# Patient Record
Sex: Male | Born: 1990 | Race: White | Hispanic: No | Smoking: Current every day smoker
Health system: Southern US, Community
[De-identification: ages and names within clinical notes are randomized; demographics above are authoritative.]

## PROBLEM LIST (undated history)

## (undated) DIAGNOSIS — Z87898 Personal history of other specified conditions: Secondary | ICD-10-CM

## (undated) HISTORY — PX: RHINOPLASTY: SHX2354

## (undated) HISTORY — PX: APPENDECTOMY: SHX54

## (undated) HISTORY — DX: Personal history of other specified conditions: Z87.898

---

## 2019-11-03 ENCOUNTER — Other Ambulatory Visit: Payer: Self-pay

## 2019-11-03 ENCOUNTER — Emergency Department (INDEPENDENT_AMBULATORY_CARE_PROVIDER_SITE_OTHER)
Admission: EM | Admit: 2019-11-03 | Discharge: 2019-11-03 | Disposition: A | Discharge status: Home / Self Care | Payer: Managed Care, Other (non HMO) | Source: Home / Self Care | Attending: Family Medicine | Admitting: Family Medicine

## 2019-11-03 ENCOUNTER — Encounter: Payer: Self-pay | Admitting: Emergency Medicine

## 2019-11-03 DIAGNOSIS — R000 Tachycardia, unspecified: Secondary | ICD-10-CM | POA: Diagnosis not present

## 2019-11-03 DIAGNOSIS — Z711 Person with feared health complaint in whom no diagnosis is made: Secondary | ICD-10-CM

## 2019-11-03 NOTE — ED Provider Notes (Signed)
[facility]    CSN: [id number] Arrival date & time: 11/03/19  1415      History   Chief Complaint Chief Complaint  Patient presents with  . Tachycardia    HPI Alejandro Jenkins is a 29 y.o. male.   Patient reports that he was mowing grass this morning and began to feel weak at about noon.  He admits that he had not been drinking enough fluids.  He checked his pulse at 208.  He denies chest pain, shortness of breath, light-headedness, and dizziness.  He feels better after resting and drinking increased fluids.  He states that he has had two previous episodes of shortness of breath upon awakening, without rapid heart rate.  The history is provided by the patient.  Palpitations Palpitations quality:  Regular Onset quality:  Sudden Timing:  Constant Progression:  Resolved Chronicity:  New Context: dehydration and exercise   Relieved by: rest. Worsened by:  Nothing Ineffective treatments:  None tried Associated symptoms: malaise/fatigue and weakness   Associated symptoms: no back pain, no chest pain, no chest pressure, no cough, no diaphoresis, no dizziness, no leg pain, no lower extremity edema, no nausea, no near-syncope, no numbness, no orthopnea, no shortness of breath, no syncope and no vomiting     Past Medical History:  Diagnosis Date  . History of snoring     There are no problems to display for this patient.   Past Surgical History:  Procedure Laterality Date  . APPENDECTOMY    . RHINOPLASTY         Home Medications    Prior to Admission medications   Medication Sig Start Date End Date Taking? Authorizing Provider  fluticasone (FLONASE) 50 MCG/ACT nasal spray Place into both nostrils daily.   Yes [provider]  loratadine (CLARITIN) 10 MG tablet Take 10 mg by mouth daily.   Yes [provider]    Family History Family History  Problem Relation Age of Onset  . Diabetes Mother   . Healthy Father     Social  History Social History   Tobacco Use  . Smoking status: Current Every Day Smoker    Types: E-cigarettes  . Smokeless tobacco: Current User  Vaping Use  . Vaping Use: Every day  . Substances: Nicotine, Flavoring  Substance Use Topics  . Alcohol use: Not Currently  . Drug use: Never     Allergies   Patient has no allergy information on record.   Review of Systems Review of Systems  Constitutional: Positive for activity change and malaise/fatigue. Negative for appetite change, chills, diaphoresis, fatigue and fever.  Respiratory: Negative for cough and shortness of breath.   Cardiovascular: Positive for palpitations. Negative for chest pain, orthopnea, syncope and near-syncope.  Gastrointestinal: Negative for nausea and vomiting.  Musculoskeletal: Negative for back pain.  Neurological: Positive for weakness. Negative for dizziness and numbness.  All other systems reviewed and are negative.    Physical Exam Triage Vital Signs ED Triage Vitals  Enc Vitals Group     BP 11/03/19 1433 117/81     Pulse Rate 11/03/19 1433 (!) 108     Resp 11/03/19 1433 15     Temp 11/03/19 1433 99 F (37.2 C)     Temp Source 11/03/19 1433 Oral     SpO2 11/03/19 1433 99 %     Weight 11/03/19 1436 234 lb (106.1 kg)     Height 11/03/19 1436 6\' 3"  (1.905 m)     Head Circumference --  Peak Flow --      Pain Score 11/03/19 1436 0     Pain Loc --      Pain Edu? --      Excl. in GC? --    No data found.  Updated Vital Signs BP 117/81 (BP Location: Right Arm)   Pulse (!) 108   Temp 99 F (37.2 C) (Oral)   Resp 15   Ht 6\' 3"  (1.905 m)   Wt 106.1 kg   SpO2 99%   BMI 29.25 kg/m   Visual Acuity Right Eye Distance:   Left Eye Distance:   Bilateral Distance:    Right Eye Near:   Left Eye Near:    Bilateral Near:     Physical Exam Nursing notes and Vital Signs reviewed. Appearance:  Patient appears stated age, and in no acute distress Eyes:  Pupils are equal, round, and  reactive to light and accomodation.  Extraocular movement is intact.  Conjunctivae are not inflamed  Ears:  Canals normal.  Tympanic membranes normal.  Nose:  Normal turbinates.  No sinus tenderness.  Pharynx:  Normal Neck:  Supple. No adenopathy Lungs:  Clear to auscultation.  Breath sounds are equal.  Moving air well. Heart:  Regular rate and rhythm without murmurs, rubs, or gallops. Rate 99 Abdomen:  Nontender without masses or hepatosplenomegaly.  Bowel sounds are present.  No CVA or flank tenderness.  Extremities:  No edema.  Skin:  No rash present.   UC Treatments / Results  Labs (all labs ordered are listed, but only abnormal results are displayed) Labs Reviewed - No data to display  EKG  Rate:  99 BPM PR:  168 msec QT:  330 msec QTcH:  423 msec QRSD:  76 msec QRS axis:  61 degrees Interpretation:  within normal limits; normal sinus rhythm    Radiology No results found.  Procedures Procedures (including critical care time)  Medications Ordered in UC Medications - No data to display  Initial Impression / Assessment and Plan / UC Course  I have reviewed the triage vital signs and the nursing notes.  Pertinent labs & imaging results that were available during my care of the patient were reviewed by me and considered in my medical decision making (see chart for details).    Normal exam and EKG reassuring. Recommend follow-up with PCP if recurring episodes of shortness of breath recur at night.   Final Clinical Impressions(s) / UC Diagnoses   Final diagnoses:  Tachycardia  Feared condition not demonstrated     Discharge Instructions     If symptoms become significantly worse during the night or over the weekend, proceed to the local emergency room.     ED Prescriptions    None        , MD 11/05/19 01/06/20

## 2019-11-03 NOTE — ED Triage Notes (Signed)
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

## 2019-11-03 NOTE — Discharge Instructions (Addendum)
If symptoms become significantly worse during the night or over the weekend, proceed to the local emergency room.  

## 2020-09-01 ENCOUNTER — Emergency Department (HOSPITAL_COMMUNITY): Payer: Managed Care, Other (non HMO)

## 2020-09-01 ENCOUNTER — Other Ambulatory Visit: Payer: Self-pay

## 2020-09-01 ENCOUNTER — Emergency Department (HOSPITAL_COMMUNITY)
Admission: EM | Admit: 2020-09-01 | Discharge: 2020-09-01 | Disposition: A | Discharge status: Home / Self Care | Payer: Managed Care, Other (non HMO) | Attending: Emergency Medicine | Admitting: Emergency Medicine

## 2020-09-01 DIAGNOSIS — R1032 Left lower quadrant pain: Secondary | ICD-10-CM | POA: Diagnosis not present

## 2020-09-01 DIAGNOSIS — I861 Scrotal varices: Secondary | ICD-10-CM | POA: Insufficient documentation

## 2020-09-01 DIAGNOSIS — F17290 Nicotine dependence, other tobacco product, uncomplicated: Secondary | ICD-10-CM | POA: Diagnosis not present

## 2020-09-01 DIAGNOSIS — N433 Hydrocele, unspecified: Secondary | ICD-10-CM | POA: Insufficient documentation

## 2020-09-01 DIAGNOSIS — N50812 Left testicular pain: Secondary | ICD-10-CM | POA: Diagnosis present

## 2020-09-01 LAB — URINALYSIS, ROUTINE W REFLEX MICROSCOPIC
Bilirubin Urine: NEGATIVE
Glucose, UA: NEGATIVE mg/dL
Hgb urine dipstick: NEGATIVE
Ketones, ur: NEGATIVE mg/dL
Leukocytes,Ua: NEGATIVE
Nitrite: NEGATIVE
Protein, ur: NEGATIVE mg/dL
Specific Gravity, Urine: 1.03 (ref 1.005–1.030)
pH: 5 (ref 5.0–8.0)

## 2020-09-01 LAB — CBC WITH DIFFERENTIAL/PLATELET
Abs Immature Granulocytes: 0.02 10*3/uL (ref 0.00–0.07)
Basophils Absolute: 0 10*3/uL (ref 0.0–0.1)
Basophils Relative: 1 %
Eosinophils Absolute: 0.4 10*3/uL (ref 0.0–0.5)
Eosinophils Relative: 6 %
HCT: 47.7 % (ref 39.0–52.0)
Hemoglobin: 16 g/dL (ref 13.0–17.0)
Immature Granulocytes: 0 %
Lymphocytes Relative: 36 %
Lymphs Abs: 2.2 10*3/uL (ref 0.7–4.0)
MCH: 30.4 pg (ref 26.0–34.0)
MCHC: 33.5 g/dL (ref 30.0–36.0)
MCV: 90.5 fL (ref 80.0–100.0)
Monocytes Absolute: 0.6 10*3/uL (ref 0.1–1.0)
Monocytes Relative: 10 %
Neutro Abs: 2.9 10*3/uL (ref 1.7–7.7)
Neutrophils Relative %: 47 %
Platelets: 268 10*3/uL (ref 150–400)
RBC: 5.27 MIL/uL (ref 4.22–5.81)
RDW: 12.7 % (ref 11.5–15.5)
WBC: 6.2 10*3/uL (ref 4.0–10.5)
nRBC: 0 % (ref 0.0–0.2)

## 2020-09-01 LAB — BASIC METABOLIC PANEL
Anion gap: 9 (ref 5–15)
BUN: 15 mg/dL (ref 6–20)
CO2: 25 mmol/L (ref 22–32)
Calcium: 9 mg/dL (ref 8.9–10.3)
Chloride: 106 mmol/L (ref 98–111)
Creatinine, Ser: 0.9 mg/dL (ref 0.61–1.24)
GFR, Estimated: >60 mL/min (ref >60)
Glucose, Bld: 78 mg/dL (ref 70–99)
Potassium: 4.4 mmol/L (ref 3.5–5.1)
Sodium: 140 mmol/L (ref 135–145)

## 2020-09-01 MED ORDER — IOHEXOL 300 MG/ML  SOLN
100.0000 mL | Freq: Once | INTRAMUSCULAR | Status: AC | PRN
  Administered 2020-09-01: 100 mL via INTRAVENOUS

## 2020-09-01 MED ORDER — OXYCODONE HCL 5 MG PO TABS: 5.0000 mg | ORAL_TABLET | Freq: Four times a day (QID) | ORAL | 0 refills | Status: AC | PRN

## 2020-09-01 MED ORDER — SODIUM CHLORIDE 0.9 % IV BOLUS
1000.0000 mL | Freq: Once | INTRAVENOUS | Status: AC
  Administered 2020-09-01: 1000 mL via INTRAVENOUS

## 2020-09-01 MED ORDER — NAPROXEN 500 MG PO TABS: 500.0000 mg | ORAL_TABLET | Freq: Two times a day (BID) | ORAL | 0 refills | Status: AC

## 2020-09-01 MED ORDER — ONDANSETRON HCL 4 MG/2ML IJ SOLN
4.0000 mg | Freq: Once | INTRAMUSCULAR | Status: AC
  Administered 2020-09-01: 4 mg via INTRAVENOUS
  Filled 2020-09-01: qty 2

## 2020-09-01 MED ORDER — MORPHINE SULFATE (PF) 4 MG/ML IV SOLN
4.0000 mg | Freq: Once | INTRAVENOUS | Status: AC
  Administered 2020-09-01: 4 mg via INTRAVENOUS
  Filled 2020-09-01: qty 1

## 2020-09-01 NOTE — Discharge Instructions (Signed)
You ice and heat to the area.  Take the naproxen scheduled over the next 3 days. DO not take additional ibuprofen containing products  Take the pain medication as prescribed. This medication may become addictive. Do not drive or operate heavy machinery while thaking this medication.  Follow up with Urology if symptoms have not improved.  If you have worsening pain seek reevaluation here in the ED

## 2020-09-01 NOTE — ED Provider Notes (Signed)
[facility] EMERGENCY DEPARTMENT Provider Note   CSN: [id number] Arrival date & time: 09/01/20  7824     History Chief Complaint  Patient presents with  . Groin Pain    Alejandro Jenkins is a 30 y.o. male with no significant past medical history who presents for evaluation of left-sided testicular pain.  Began suddenly this morning.  Pain constant however worse with movement.  Feels better if he lays flat.  Did state he recently moved yesterday.  He has not noted any redness, swelling, warmth to his testicles.  He denies any penile discharge or any concerns for any STDs.  No rashes.  He denies any prior history of inguinal hernias.  Not taking anything for his pain.  Rates his pain a 10/10.  No dysuria, hematuria, flank pain.  No prior history of kidney stones.  Denies fever, chills, nausea, vomiting, chest pain, shortness of breath, back pain, urinary complaints, paresthesias or weakness.  Denies additional aggravating or alleviating factors.  History obtained from patient and past medical records.  No interpreter used  HPI     Past Medical History:  Diagnosis Date  . History of snoring     There are no problems to display for this patient.   Past Surgical History:  Procedure Laterality Date  . APPENDECTOMY    . RHINOPLASTY         Family History  Problem Relation Age of Onset  . Diabetes Mother   . Healthy Father     Social History   Tobacco Use  . Smoking status: Current Every Day Smoker    Types: E-cigarettes  . Smokeless tobacco: Current User  Vaping Use  . Vaping Use: Every day  . Substances: Nicotine, Flavoring  Substance Use Topics  . Alcohol use: Not Currently  . Drug use: Never    Home Medications Prior to Admission medications   Medication Sig Start Date End Date Taking? Authorizing Provider  naproxen (NAPROSYN) 500 MG tablet Take 1 tablet (500 mg total) by mouth 2 (two) times daily. 09/01/20  Yes Sanaa Zilberman A, PA-C   oxyCODONE (ROXICODONE) 5 MG immediate release tablet Take 1 tablet (5 mg total) by mouth every 6 (six) hours as needed for up to 3 days for severe pain. 09/01/20 09/04/20 Yes Orian Amberg A, PA-C  fluticasone (FLONASE) 50 MCG/ACT nasal spray Place into both nostrils daily.    [provider]  loratadine (CLARITIN) 10 MG tablet Take 10 mg by mouth daily.    [provider]    Allergies    Other  Review of Systems   Review of Systems  Constitutional: Negative.   HENT: Negative.   Respiratory: Negative.   Cardiovascular: Negative.   Gastrointestinal: Negative.   Genitourinary: Positive for testicular pain. Negative for decreased urine volume, difficulty urinating, dysuria, flank pain, frequency, hematuria, penile discharge, penile pain, penile swelling and scrotal swelling.  Musculoskeletal: Negative.   Skin: Negative.   Neurological: Negative.   All other systems reviewed and are negative.   Physical Exam Updated Vital Signs BP 104/89   Pulse 70   Temp 97.6 F (36.4 C) (Oral)   Resp 16   SpO2 98%   Physical Exam Vitals and nursing note reviewed. Exam conducted with a chaperone present.  Constitutional:      General: He is not in acute distress.    Appearance: He is well-developed. He is not ill-appearing, toxic-appearing or diaphoretic.  HENT:     Head: Normocephalic and atraumatic.  Nose: Nose normal.     Mouth/Throat:     Mouth: Mucous membranes are moist.  Eyes:     Pupils: Pupils are equal, round, and reactive to light.  Cardiovascular:     Rate and Rhythm: Normal rate and regular rhythm.     Pulses: Normal pulses.     Heart sounds: Normal heart sounds.  Pulmonary:     Effort: Pulmonary effort is normal. No respiratory distress.     Breath sounds: Normal breath sounds.  Abdominal:     General: Bowel sounds are normal. There is no distension.     Palpations: Abdomen is soft.     Tenderness: There is no abdominal tenderness. There is no  right CVA tenderness, left CVA tenderness or guarding.  Genitourinary:    Pubic Area: No rash or pubic lice.      Penis: Normal. No phimosis, paraphimosis, hypospadias, erythema, tenderness, discharge, swelling or lesions.      Testes:        Right: Tenderness present. Swelling not present.        Left: Tenderness present. Swelling not present.     Comments: Tech in room as chaperone. Difficult exam due to patients pain. Diffuse tenderness to bilateral testicles, L>R. No gross edema, erythema, warmth. No rashes or lesions. No gross hernias Musculoskeletal:        General: Normal range of motion.     Cervical back: Normal range of motion and neck supple.  Skin:    General: Skin is warm and dry.     Capillary Refill: Capillary refill takes less than 2 seconds.  Neurological:     General: No focal deficit present.     Mental Status: He is alert.     ED Results / Procedures / Treatments   Labs (all labs ordered are listed, but only abnormal results are displayed) Labs Reviewed  URINALYSIS, ROUTINE W REFLEX MICROSCOPIC - Abnormal; Notable for the following components:      Result Value   Color, Urine AMBER (*)    All other components within normal limits  CBC WITH DIFFERENTIAL/PLATELET  BASIC METABOLIC PANEL  GC/CHLAMYDIA PROBE AMP () NOT AT [facility]    EKG None  Radiology CT Abdomen Pelvis W Contrast  Result Date: 09/01/2020 CLINICAL DATA:  Left lower quadrant abdominal pain EXAM: CT ABDOMEN AND PELVIS WITH CONTRAST TECHNIQUE: Multidetector CT imaging of the abdomen and pelvis was performed using the standard protocol following bolus administration of intravenous contrast. CONTRAST:  OMNIPAQUE IOHEXOL 300 MG/ML  SOLN COMPARISON:  None. FINDINGS: Lower chest: No acute abnormality. Bandlike scarring or atelectasis of the right lung. Hepatobiliary: No solid liver abnormality is seen. No gallstones, gallbladder wall thickening, or biliary dilatation. Pancreas: Unremarkable.  No pancreatic ductal dilatation or surrounding inflammatory changes. Spleen: Normal in size without significant abnormality. Adrenals/Urinary Tract: Adrenal glands are unremarkable. Kidneys are normal, without renal calculi, solid lesion, or hydronephrosis. Bladder is unremarkable. Stomach/Bowel: Stomach is within normal limits. Appendix is surgically absent. No evidence of bowel wall thickening, distention, or inflammatory changes. Vascular/Lymphatic: No significant vascular findings are present. No enlarged abdominal or pelvic lymph nodes. Reproductive: No mass or other significant abnormality. Other: No abdominal wall hernia or abnormality. No abdominopelvic ascites. Musculoskeletal: No acute or significant osseous findings. IMPRESSION: 1. No CT findings of the abdomen or pelvis to explain left lower quadrant abdominal pain. 2. Status post appendectomy. Electronically Signed   By: Lauralyn Primes M.D.   On: 09/01/2020 13:48   US SCROTUM  W/DOPPLER  Result Date: 09/01/2020 CLINICAL DATA:  Left scrotal pain EXAM: SCROTAL ULTRASOUND DOPPLER ULTRASOUND OF THE TESTICLES TECHNIQUE: Complete ultrasound examination of the testicles, epididymis, and other scrotal structures was performed. Color and spectral Doppler ultrasound were also utilized to evaluate blood flow to the testicles. COMPARISON:  None. FINDINGS: Right testicle Measurements: 5.5 x 2.9 x 3.4 cm. No mass or microlithiasis visualized. Left testicle Measurements: 5.1 x 2.6 x 3.7 cm. No mass or microlithiasis visualized. Right epididymis: Normal in size and appearance. No appreciable edema or hyperemia. Left epididymis: Normal in size and appearance. No appreciable edema or hyperemia. Hydrocele: Minimal hydrocele on the right. No appreciable hydrocele on the left. Varicocele: There is a varicocele on each side, larger on the left than on the right. Pulsed Doppler interrogation of both testes demonstrates normal low resistance arterial and venous waveforms  bilaterally. No scrotal wall thickening. Several benign-appearing lymph nodes are noted in the left inguinal region at the site of pain. These lymph nodes do not appear enlarged. IMPRESSION: 1. There is a varicocele on each side, larger on the left than on the right. Left-sided varicocele noted in region of patient reported pain. 2. Several nonenlarged lymph nodes are noted in the area of pain in the left inguinal region. Significance of this finding uncertain. These lymph nodes show central fatty hila and appear benign. A degree of mesenteric adenitis could present in this manner. 3. No intratesticular or extratesticular mass or inflammatory focus. No testicular torsion on either side. 4. Minimal hydrocele on the right. No hydrocele appreciable on the left. Electronically Signed   By: Bretta Bang III M.D.   On: 09/01/2020 10:18    Procedures Procedures   Medications Ordered in ED Medications  sodium chloride 0.9 % bolus 1,000 mL (0 mLs Intravenous Stopped 09/01/20 1227)  ondansetron (ZOFRAN) injection 4 mg (4 mg Intravenous Given 09/01/20 1113)  morphine 4 MG/ML injection 4 mg (4 mg Intravenous Given 09/01/20 1114)  iohexol (OMNIPAQUE) 300 MG/ML solution 100 mL (100 mLs Intravenous Contrast Given 09/01/20 1318)    ED Course  I have reviewed the triage vital signs and the nursing notes.  Pertinent labs & imaging results that were available during my care of the patient were reviewed by me and considered in my medical decision making (see chart for details).  30 year old here for evaluation of left-sided testicular pain.  He is afebrile, nonseptic, non-ill-appearing.  No concerns for any STDs.  Overall difficult exam due to amount of pain.  Patient was able to stand at bedside however has diffuse tenderness to bilateral testicles left greater than right.  No obvious rashes or lesions.  No obvious edema, erythema or warmth to testicles.  Concern for testicular torsion versus incarcerated hernia versus  infectious process.  Did talk to ultrasound tech to have patient moved to next list for ultrasound due to concern for torsion.  Labs imaging personally reviewed and interpreted:  Ultrasound with bilateral varicocele, left greater than right, small hydrocele on the right.  There is some enlarged lymph nodes.  No evidence of gross hernias.  Patient did have pain during exam and had good blood flow without evidence of torsion, low suspicion for intermittent torsion CT AP WO acute abnormality CBC without leukocytosis Metabolic panel without electrolyte, renal abnormality GC pending UA negative for infection  Patient reassessed.  Discussed labs and imaging.  He was having pain when ultrasound was taken, therefore I have low suspicion for intermittent torsion.  DC home with symptomatic management  for varicoceles, close follow-up with urology.  Patient does not meet the SIRS or Sepsis criteria.  On repeat exam patient does not have a surgical abdomin and there are no peritoneal signs.  No indication of appendicitis, bowel obstruction, bowel perforation, cholecystitis, diverticulitis, intermittent/persistent torsion, epididymitis, incarcerated/ strangulated hernia, abscess.   The patient has been appropriately medically screened and/or stabilized in the ED. I have low suspicion for any other emergent medical condition which would require further screening, evaluation or treatment in the ED or require inpatient management.  Patient is hemodynamically stable and in no acute distress.  Patient able to ambulate in department prior to ED.  Evaluation does not show acute pathology that would require ongoing or additional emergent interventions while in the emergency department or further inpatient treatment.  I have discussed the diagnosis with the patient and answered all questions.  Pain is been managed while in the emergency department and patient has no further complaints prior to discharge.  Patient is  comfortable with plan discussed in room and is stable for discharge at this time.  I have discussed strict return precautions for returning to the emergency department.  Patient was encouraged to follow-up with PCP/specialist refer to at discharge.      MDM Rules/Calculators/A&P                          Final Clinical Impression(s) / ED Diagnoses Final diagnoses:  Varicocele  Hydrocele, unspecified hydrocele type    Rx / DC Orders ED Discharge Orders         Ordered    naproxen (NAPROSYN) 500 MG tablet  2 times daily        09/01/20 1423    oxyCODONE (ROXICODONE) 5 MG immediate release tablet  Every 6 hours PRN        09/01/20 1423           Inza Mikrut A, PA-C 09/01/20 1700    Benjiman CorePickering, Nathan, MD 09/02/20 [id number]

## 2020-09-01 NOTE — ED Notes (Signed)
Specimen cup at bedside, pt made aware of sample needs per MD order

## 2020-09-01 NOTE — ED Triage Notes (Signed)
Pt reports waking up this morning with severe L groin pain. Pt unsure of any swelling.

## 2020-09-01 NOTE — ED Notes (Signed)
Pt d/c home per MD order. Discharge summary reviewed, pt verbalizes understanding. No s/s of acute distress noted. Ambulatory off unit. This RN offered pt a cab, pt refused. Britini PA made aware; ok to d/c pt at this time.

## 2020-09-01 NOTE — ED Notes (Signed)
Pt transported to CT ?

## 2020-09-02 ENCOUNTER — Emergency Department (HOSPITAL_COMMUNITY)
Admission: EM | Admit: 2020-09-02 | Discharge: 2020-09-03 | Disposition: A | Discharge status: Home / Self Care | Payer: Managed Care, Other (non HMO) | Attending: Emergency Medicine | Admitting: Emergency Medicine

## 2020-09-02 ENCOUNTER — Emergency Department (HOSPITAL_COMMUNITY): Payer: Managed Care, Other (non HMO)

## 2020-09-02 ENCOUNTER — Other Ambulatory Visit: Payer: Self-pay

## 2020-09-02 DIAGNOSIS — F17290 Nicotine dependence, other tobacco product, uncomplicated: Secondary | ICD-10-CM | POA: Diagnosis not present

## 2020-09-02 DIAGNOSIS — L02214 Cutaneous abscess of groin: Secondary | ICD-10-CM

## 2020-09-02 DIAGNOSIS — R1030 Lower abdominal pain, unspecified: Secondary | ICD-10-CM | POA: Diagnosis present

## 2020-09-02 LAB — CBC WITH DIFFERENTIAL/PLATELET
Abs Immature Granulocytes: 0.01 10*3/uL (ref 0.00–0.07)
Basophils Absolute: 0 10*3/uL (ref 0.0–0.1)
Basophils Relative: 1 %
Eosinophils Absolute: 0.3 10*3/uL (ref 0.0–0.5)
Eosinophils Relative: 6 %
HCT: 47.1 % (ref 39.0–52.0)
Hemoglobin: 15.9 g/dL (ref 13.0–17.0)
Immature Granulocytes: 0 %
Lymphocytes Relative: 23 %
Lymphs Abs: 1.4 10*3/uL (ref 0.7–4.0)
MCH: 30.6 pg (ref 26.0–34.0)
MCHC: 33.8 g/dL (ref 30.0–36.0)
MCV: 90.6 fL (ref 80.0–100.0)
Monocytes Absolute: 0.6 10*3/uL (ref 0.1–1.0)
Monocytes Relative: 10 %
Neutro Abs: 3.6 10*3/uL (ref 1.7–7.7)
Neutrophils Relative %: 60 %
Platelets: 278 10*3/uL (ref 150–400)
RBC: 5.2 MIL/uL (ref 4.22–5.81)
RDW: 12.7 % (ref 11.5–15.5)
WBC: 5.9 10*3/uL (ref 4.0–10.5)
nRBC: 0 % (ref 0.0–0.2)

## 2020-09-02 LAB — URINALYSIS, ROUTINE W REFLEX MICROSCOPIC
Bacteria, UA: NONE SEEN
Bilirubin Urine: NEGATIVE
Glucose, UA: NEGATIVE mg/dL
Hgb urine dipstick: NEGATIVE
Ketones, ur: NEGATIVE mg/dL
Leukocytes,Ua: NEGATIVE
Nitrite: NEGATIVE
Protein, ur: 30 mg/dL — AB
Specific Gravity, Urine: 1.032 — ABNORMAL HIGH (ref 1.005–1.030)
pH: 5 (ref 5.0–8.0)

## 2020-09-02 LAB — GC/CHLAMYDIA PROBE AMP (~~LOC~~) NOT AT ARMC
Chlamydia: NEGATIVE
Comment: NEGATIVE
Comment: NORMAL
Neisseria Gonorrhea: NEGATIVE

## 2020-09-02 LAB — BASIC METABOLIC PANEL
Anion gap: 8 (ref 5–15)
BUN: 12 mg/dL (ref 6–20)
CO2: 22 mmol/L (ref 22–32)
Calcium: 8.8 mg/dL — ABNORMAL LOW (ref 8.9–10.3)
Chloride: 107 mmol/L (ref 98–111)
Creatinine, Ser: 0.86 mg/dL (ref 0.61–1.24)
GFR, Estimated: >60 mL/min (ref >60)
Glucose, Bld: 98 mg/dL (ref 70–99)
Potassium: 4.3 mmol/L (ref 3.5–5.1)
Sodium: 137 mmol/L (ref 135–145)

## 2020-09-02 NOTE — ED Provider Notes (Signed)
Emergency Medicine Provider Triage Evaluation Note  Alejandro Jenkins , a 30 y.o. male  was evaluated in triage.  Pt complains of worsening testicular pain and bleeding.  Patient seen and evaluated yesterday for bilateral severe testicular pain, had reassuring ultrasound, CT and urinalysis.  Pain slightly worsened today but noticed this morning some blood coming from around his testicles.  He did not look at specifically where this blood was coming from, denies any injury or trauma to the area.  Review of Systems  Positive: Testicular pain, bleeding Negative: Abdominal pain, dysuria, hematuria, fevers  Physical Exam  BP (!) 131/96   Pulse 90   Temp 98.3 F (36.8 C) (Oral)   Resp 16   SpO2 98%  Gen:   Awake, very uncomfortable on exam Resp:  Normal effort  MSK:   Moves extremities without difficulty  Other:  Testicles red, painful and swollen, more so on the left than the right.  Some dried and clotted blood over the left side of the scrotum and left thigh, no active bleeding, exam is very limited due to patient discomfort, unable to determine source of bleeding.  Medical Decision Making  Medically screening exam initiated at 3:52 PM.  Appropriate orders placed.  Alejandro Jenkins was informed that the remainder of the evaluation will be completed by another provider, this initial triage assessment does not replace that evaluation, and the importance of remaining in the ED until their evaluation is complete.     Dartha Lodge, PA-C 09/02/20 1558    Benjiman Core, MD 09/02/20 [phone]

## 2020-09-02 NOTE — ED Triage Notes (Signed)
Pt seen yesterday for L groin pain. Dx with varicoceles and d/c. Pt reports waking up this afternoon and blood coming from same area on groin and pain is significantly increased. Bleeding controlled at this time.

## 2020-09-03 MED ORDER — SULFAMETHOXAZOLE-TRIMETHOPRIM 800-160 MG PO TABS
1.0000 | ORAL_TABLET | Freq: Once | ORAL | Status: AC
  Administered 2020-09-03: 1 via ORAL
  Filled 2020-09-03: qty 1

## 2020-09-03 MED ORDER — SULFAMETHOXAZOLE-TRIMETHOPRIM 800-160 MG PO TABS: 1.0000 | ORAL_TABLET | Freq: Two times a day (BID) | ORAL | 0 refills | Status: AC

## 2020-09-03 NOTE — ED Provider Notes (Signed)
[facility]   CSN: [id number] Arrival date & time: 09/02/20  1518     History Chief Complaint  Patient presents with  . Groin Pain    Alejandro Jenkins is a 30 y.o. male.  Patient presents to the emergency department for evaluation of bleeding from his groin.  Patient was seen in the emergency department yesterday with significant pain in the area of the testicles.  He reports that he had a work-up and was discharged with pain medication.  Pain continued at home and then around 3:00 today started having bleeding spontaneously.        Past Medical History:  Diagnosis Date  . History of snoring     There are no problems to display for this patient.   Past Surgical History:  Procedure Laterality Date  . APPENDECTOMY    . RHINOPLASTY         Family History  Problem Relation Age of Onset  . Diabetes Mother   . Healthy Father     Social History   Tobacco Use  . Smoking status: Current Every Day Smoker    Types: E-cigarettes  . Smokeless tobacco: Current User  Vaping Use  . Vaping Use: Every day  . Substances: Nicotine, Flavoring  Substance Use Topics  . Alcohol use: Not Currently  . Drug use: Never    Home Medications Prior to Admission medications   Medication Sig Start Date End Date Taking? Authorizing Provider  naproxen sodium (ALEVE) 220 MG tablet Take 220-440 mg by mouth daily as needed (pain).   Yes [provider]  sulfamethoxazole-trimethoprim (BACTRIM DS) 800-160 MG tablet Take 1 tablet by mouth 2 (two) times daily for 7 days. 09/03/20 09/10/20 Yes Samari Bittinger, Canary Brimhristopher J, MD  naproxen (NAPROSYN) 500 MG tablet Take 1 tablet (500 mg total) by mouth 2 (two) times daily. Patient not taking: Reported on 09/03/2020 09/01/20   Henderly, Britni A, PA-C  oxyCODONE (ROXICODONE) 5 MG immediate release tablet Take 1 tablet (5 mg total) by mouth every 6 (six) hours as needed for up to 3 days for severe  pain. Patient not taking: Reported on 09/03/2020 09/01/20 09/04/20  Henderly, Britni A, PA-C    Allergies    Other  Review of Systems   Review of Systems  Genitourinary:       Groin pain  Skin: Positive for wound.  All other systems reviewed and are negative.   Physical Exam Updated Vital Signs BP 132/76 (BP Location: Right Arm)   Pulse 69   Temp 97.8 F (36.6 C) (Oral)   Resp 15   SpO2 96%   Physical Exam Vitals and nursing note reviewed.  Constitutional:      General: He is not in acute distress.    Appearance: Normal appearance. He is well-developed.  HENT:     Head: Normocephalic and atraumatic.     Right Ear: Hearing normal.     Left Ear: Hearing normal.     Nose: Nose normal.  Eyes:     Conjunctiva/sclera: Conjunctivae normal.     Pupils: Pupils are equal, round, and reactive to light.  Cardiovascular:     Rate and Rhythm: Regular rhythm.     Heart sounds: S1 normal and S2 normal. No murmur heard. No friction rub. No gallop.   Pulmonary:     Effort: Pulmonary effort is normal. No respiratory distress.     Breath sounds: Normal breath sounds.  Chest:     Chest wall:  No tenderness.  Abdominal:     General: Bowel sounds are normal.     Palpations: Abdomen is soft.     Tenderness: There is no abdominal tenderness. There is no guarding or rebound. Negative signs include Murphy's sign and McBurney's sign.     Hernia: No hernia is present.  Genitourinary:    Comments: Area of tenderness and slight fluctuance just to the left of the base of the penis.  There is a 3 mm opening present that expresses bloody pus when pressed. Musculoskeletal:        General: Normal range of motion.     Cervical back: Normal range of motion and neck supple.  Skin:    General: Skin is warm and dry.     Findings: No rash.  Neurological:     Mental Status: He is alert and oriented to person, place, and time.     GCS: GCS eye subscore is 4. GCS verbal subscore is 5. GCS motor subscore is  6.     Cranial Nerves: No cranial nerve deficit.     Sensory: No sensory deficit.     Coordination: Coordination normal.  Psychiatric:        Speech: Speech normal.        Behavior: Behavior normal.        Thought Content: Thought content normal.     ED Results / Procedures / Treatments   Labs (all labs ordered are listed, but only abnormal results are displayed) Labs Reviewed  URINALYSIS, ROUTINE W REFLEX MICROSCOPIC - Abnormal; Notable for the following components:      Result Value   Specific Gravity, Urine 1.032 (*)    Protein, ur 30 (*)    All other components within normal limits  BASIC METABOLIC PANEL - Abnormal; Notable for the following components:   Calcium 8.8 (*)    All other components within normal limits  CBC WITH DIFFERENTIAL/PLATELET    EKG None  Radiology CT Abdomen Pelvis W Contrast  Addendum Date: 09/03/2020   ADDENDUM REPORT: 09/03/2020 01:16 ADDENDUM: There is a 1 cm nodular area along the superior aspect of the lateral left scrotal wall with induration of the surrounding tissue most consistent with phlegmonous changes or developing abscess. These findings were discussed with Dr. Blinda Leatherwood, at the time of this addendum. Electronically Signed   By: Elgie Collard M.D.   On: 09/03/2020 01:16   Result Date: 09/03/2020 CLINICAL DATA:  Left lower quadrant abdominal pain EXAM: CT ABDOMEN AND PELVIS WITH CONTRAST TECHNIQUE: Multidetector CT imaging of the abdomen and pelvis was performed using the standard protocol following bolus administration of intravenous contrast. CONTRAST:  OMNIPAQUE IOHEXOL 300 MG/ML  SOLN COMPARISON:  None. FINDINGS: Lower chest: No acute abnormality. Bandlike scarring or atelectasis of the right lung. Hepatobiliary: No solid liver abnormality is seen. No gallstones, gallbladder wall thickening, or biliary dilatation. Pancreas: Unremarkable. No pancreatic ductal dilatation or surrounding inflammatory changes. Spleen: Normal in size  without significant abnormality. Adrenals/Urinary Tract: Adrenal glands are unremarkable. Kidneys are normal, without renal calculi, solid lesion, or hydronephrosis. Bladder is unremarkable. Stomach/Bowel: Stomach is within normal limits. Appendix is surgically absent. No evidence of bowel wall thickening, distention, or inflammatory changes. Vascular/Lymphatic: No significant vascular findings are present. No enlarged abdominal or pelvic lymph nodes. Reproductive: No mass or other significant abnormality. Other: No abdominal wall hernia or abnormality. No abdominopelvic ascites. Musculoskeletal: No acute or significant osseous findings. IMPRESSION: 1. No CT findings of the abdomen or pelvis to explain  left lower quadrant abdominal pain. 2. Status post appendectomy. Electronically Signed: By: Lauralyn Primes M.D. On: 09/01/2020 13:48   US SCROTUM W/DOPPLER  Result Date: 09/02/2020 CLINICAL DATA:  Worsening testicular pain. EXAM: SCROTAL ULTRASOUND DOPPLER ULTRASOUND OF THE TESTICLES TECHNIQUE: Complete ultrasound examination of the testicles, epididymis, and other scrotal structures was performed. Color and spectral Doppler ultrasound were also utilized to evaluate blood flow to the testicles. COMPARISON:  Sep 01, 2020 FINDINGS: Right testicle Measurements: 4.8 cm x 2.6 cm x 3.0 cm. No mass or microlithiasis visualized. Left testicle Measurements: 5.4 cm x 2.3 cm x 3.3 cm. No mass or microlithiasis visualized. Right epididymis:  Normal in size and appearance. Left epididymis:  Normal in size and appearance. Hydrocele:  A very small right-sided hydrocele is seen. 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 Pulsed Doppler interrogation of both testes demonstrates normal low resistance arterial and venous waveforms bilaterally. Other: Possible bleeding from the urethra is noted by the ultrasound technologist. IMPRESSION: 1. Bilateral varicoceles. 2. Possible bleeding from the urethra, as per the ultrasound  technologist. Correlation with physical examination is recommended. 3. Very small right-sided hydrocele. Electronically Signed   By: Aram Candela M.D.   On: 09/02/2020 16:54   US SCROTUM W/DOPPLER  Result Date: 09/01/2020 CLINICAL DATA:  Left scrotal pain EXAM: SCROTAL ULTRASOUND DOPPLER ULTRASOUND OF THE TESTICLES TECHNIQUE: Complete ultrasound examination of the testicles, epididymis, and other scrotal structures was performed. Color and spectral Doppler ultrasound were also utilized to evaluate blood flow to the testicles. COMPARISON:  None. FINDINGS: Right testicle Measurements: 5.5 x 2.9 x 3.4 cm. No mass or microlithiasis visualized. Left testicle Measurements: 5.1 x 2.6 x 3.7 cm. No mass or microlithiasis visualized. Right epididymis: Normal in size and appearance. No appreciable edema or hyperemia. Left epididymis: Normal in size and appearance. No appreciable edema or hyperemia. Hydrocele: Minimal hydrocele on the right. No appreciable hydrocele on the left. Varicocele: There is a varicocele on each side, larger on the left than on the right. Pulsed Doppler interrogation of both testes demonstrates normal low resistance arterial and venous waveforms bilaterally. No scrotal wall thickening. Several benign-appearing lymph nodes are noted in the left inguinal region at the site of pain. These lymph nodes do not appear enlarged. IMPRESSION: 1. There is a varicocele on each side, larger on the left than on the right. Left-sided varicocele noted in region of patient reported pain. 2. Several nonenlarged lymph nodes are noted in the area of pain in the left inguinal region. Significance of this finding uncertain. These lymph nodes show central fatty hila and appear benign. A degree of mesenteric adenitis could present in this manner. 3. No intratesticular or extratesticular mass or inflammatory focus. No testicular torsion on either side. 4. Minimal hydrocele on the right. No hydrocele appreciable on the  left. Electronically Signed   By: Bretta Bang III M.D.   On: 09/01/2020 10:18    Procedures Procedures   Medications Ordered in ED Medications  sulfamethoxazole-trimethoprim (BACTRIM DS) 800-160 MG per tablet 1 tablet (has no administration in time range)    ED Course  I have reviewed the triage vital signs and the nursing notes.  Pertinent labs & imaging results that were available during my care of the patient were reviewed by me and considered in my medical decision making (see chart for details).    MDM Rules/Calculators/A&P  Patient was seen in the ED yesterday with groin area pain.  He had ultrasound of his scrotum which ruled out any evidence of torsion or orchitis.  He also had a CT scan that did not show any acute abnormality.  When I review the images, however, I do see evidence of an abscess in the soft tissues present at the area where he is draining currently.  This does not track deep, no concern for Fournier's gangrene.  Patient does report that he feels some relief of the pain since he started draining.  As it is spontaneously draining, does not require incision and drainage.  Recommend warm soaks and will prescribe Bactrim.  Final Clinical Impression(s) / ED Diagnoses Final diagnoses:  Cutaneous abscess of groin    Rx / DC Orders ED Discharge Orders         Ordered    sulfamethoxazole-trimethoprim (BACTRIM DS) 800-160 MG tablet  2 times daily        09/03/20 0131           Gilda Crease, MD 09/03/20 [phone]

## 2022-08-05 IMAGING — US US SCROTUM W/ DOPPLER COMPLETE
1 series · 13 of 25 positions shown · non-contrast
Comparison: None.

CLINICAL DATA: Left scrotal pain

EXAM:
SCROTAL ULTRASOUND
DOPPLER ULTRASOUND OF THE TESTICLES
TECHNIQUE: Complete ultrasound examination of the testicles, epididymis, and
other scrotal structures was performed. Color and spectral Doppler
ultrasound were also utilized to evaluate blood flow to the
testicles.

[Series 1: us scrotum w/doppler · 13 of 85 slices shown]
[im 1/85]
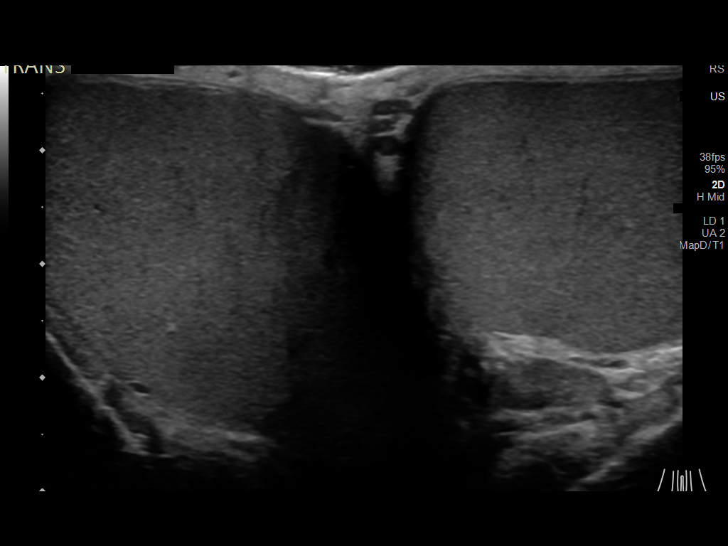
[im 8/85]
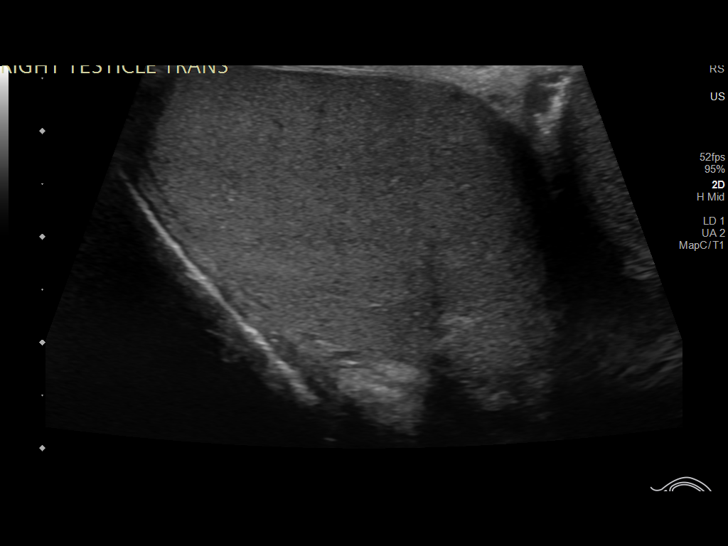
[im 15/85]
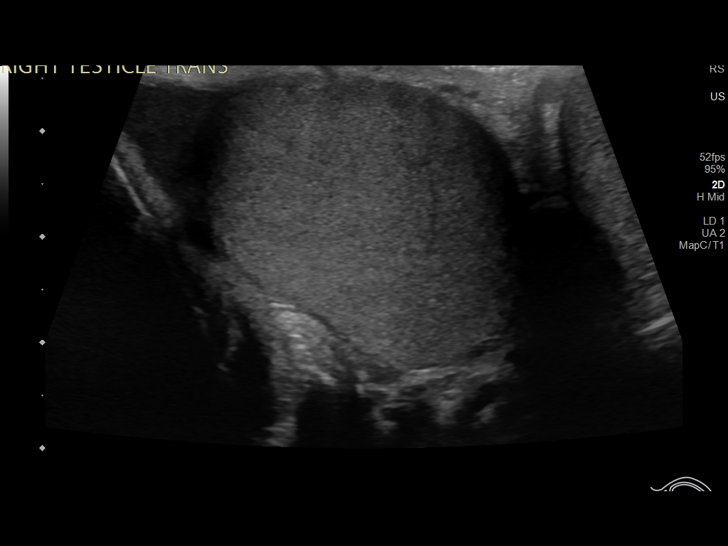
[im 22/85]
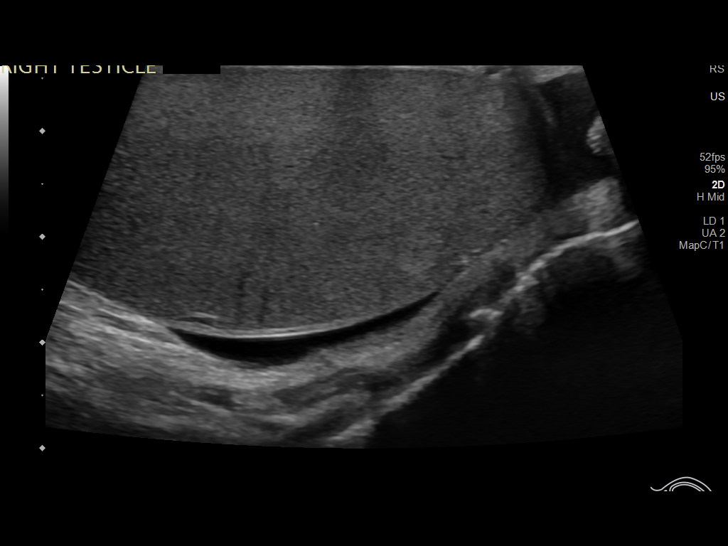
[im 29/85]
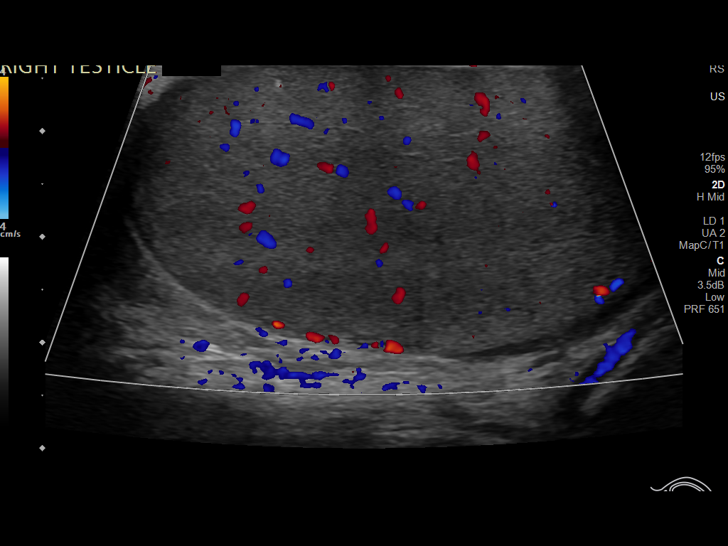
[im 36/85]
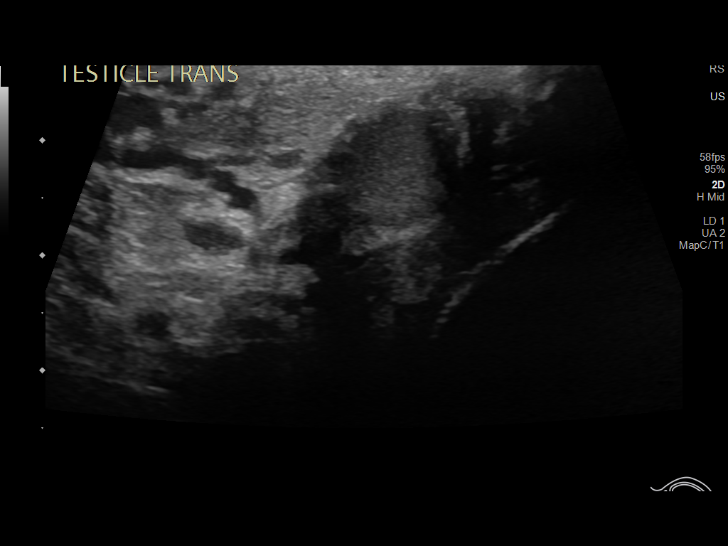
[im 43/85]
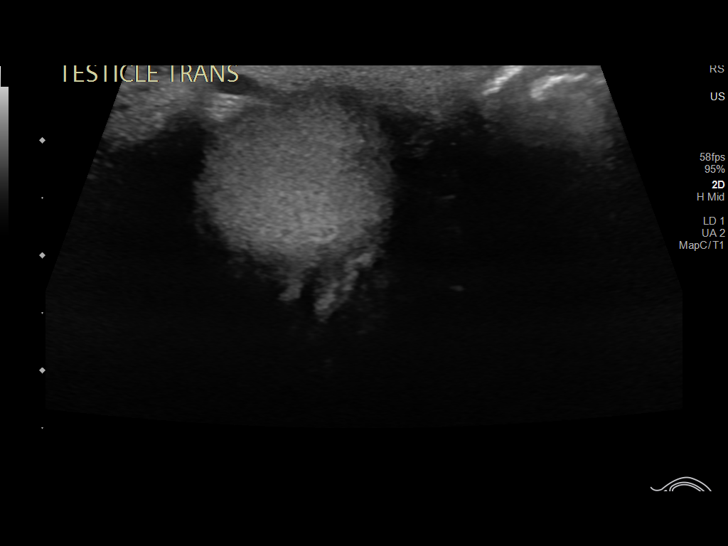
[im 50/85]
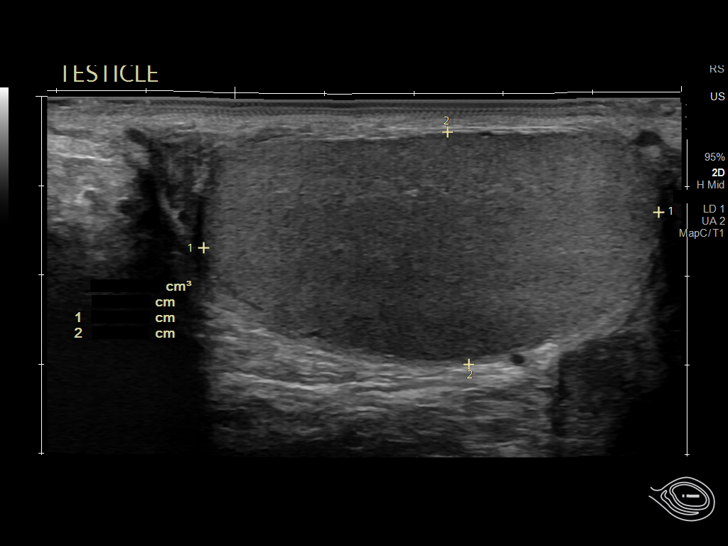
[im 57/85]
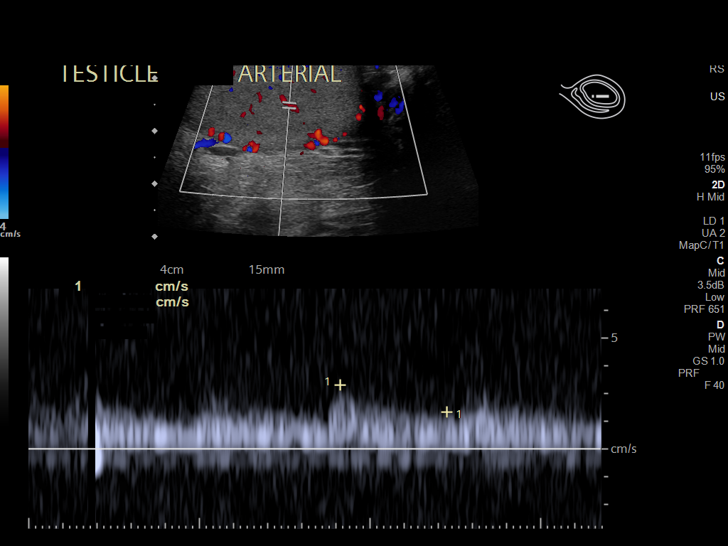
[im 64/85]
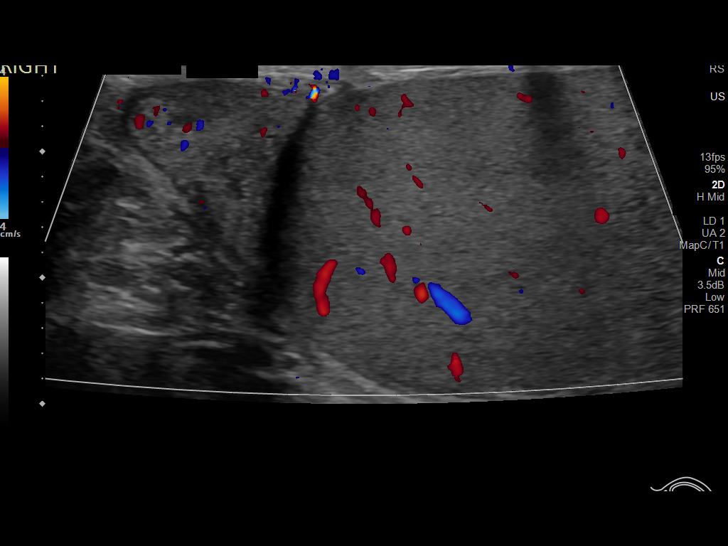
[im 71/85]
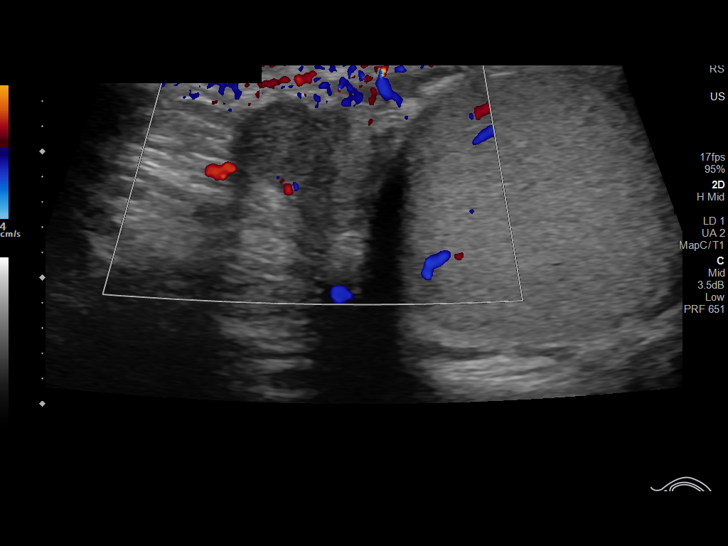
[im 78/85]
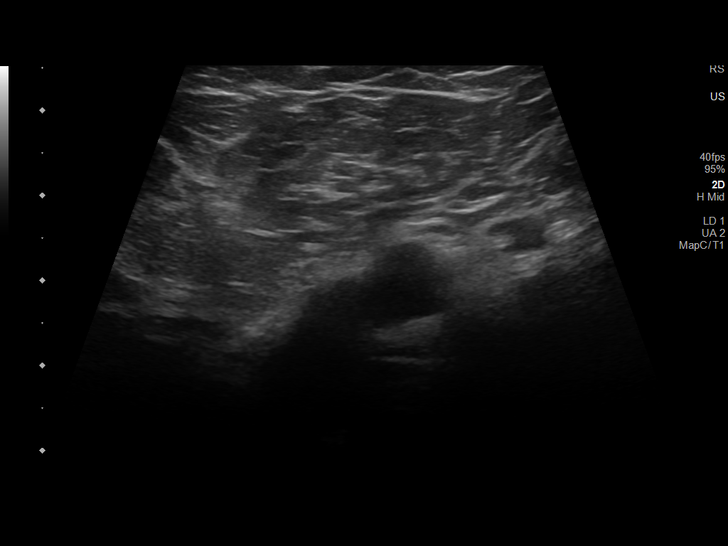
[im 85/85]
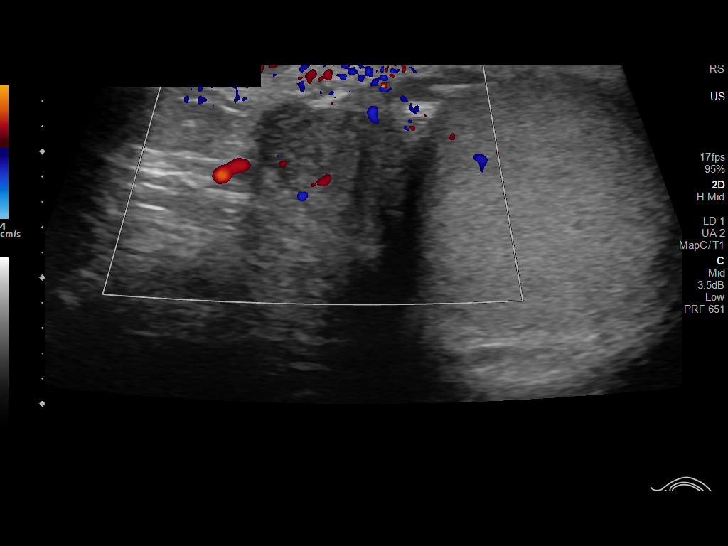

[13 of 25 positions shown; findings below may reference images not displayed]

FINDINGS: Right testicle

Measurements: 5.5 x 2.9 x 3.4 cm. No mass or microlithiasis
visualized.

Left testicle

Measurements: 5.1 x 2.6 x 3.7 cm. No mass or microlithiasis
visualized.

Right epididymis: Normal in size and appearance. No appreciable
edema or hyperemia.

Left epididymis: Normal in size and appearance. No appreciable edema
or hyperemia.

Hydrocele: Minimal hydrocele on the right. No appreciable hydrocele
on the left.

Varicocele: There is a varicocele on each side, larger on the left
than on the right.

Pulsed Doppler interrogation of both testes demonstrates normal low
resistance arterial and venous waveforms bilaterally.

No scrotal wall thickening. Several benign-appearing lymph nodes are
noted in the left inguinal region at the site of pain. These lymph
nodes do not appear enlarged.
IMPRESSION: 1. There is a varicocele on each side, larger on the left than on
the right. Left-sided varicocele noted in region of patient reported
pain.

2. Several nonenlarged lymph nodes are noted in the area of pain in
the left inguinal region. Significance of this finding uncertain.
These lymph nodes show central fatty hila and appear benign. A
degree of mesenteric adenitis could present in this manner.

3. No intratesticular or extratesticular mass or inflammatory focus.
No testicular torsion on either side.

4. Minimal hydrocele on the right. No hydrocele appreciable on the
left.

## 2022-08-05 IMAGING — CT CT ABD-PELV W/ CM
2 of 4 series · 15 of 46 positions shown, 17 images · IV contrast (Omni 300)
Comparison: None.
COMPARISON: None.

Addendum:
CLINICAL DATA: Left lower quadrant abdominal pain

EXAM:
CT ABDOMEN AND PELVIS WITH CONTRAST
TECHNIQUE: Multidetector CT imaging of the abdomen and pelvis was performed
using the standard protocol following bolus administration of
intravenous contrast.
CONTRAST:  100mL OMNIPAQUE IOHEXOL 300 MG/ML  SOLN

[Series 3: a/p w/ 5mm · axial · 0.92mm/px · z∈[-547,-77]mm · 12 of 108 slices shown, 14 images]
[im 9/108  soft-tissue]
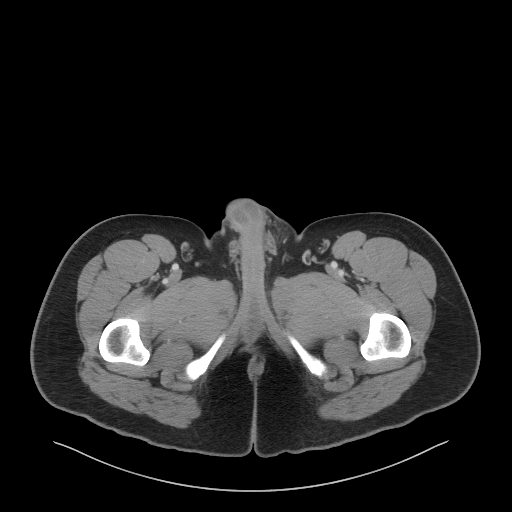
[im 9/108  bone]
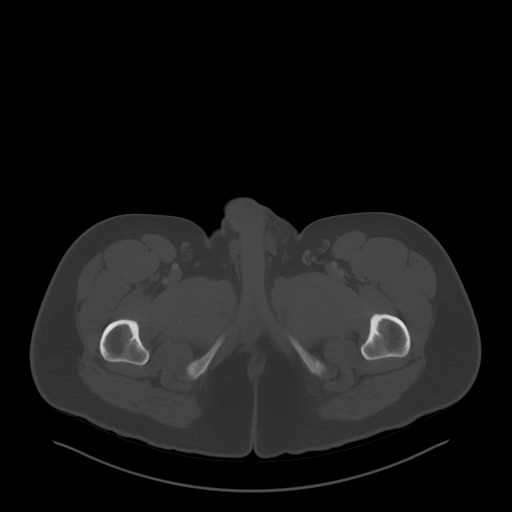
[im 18/108  soft-tissue]
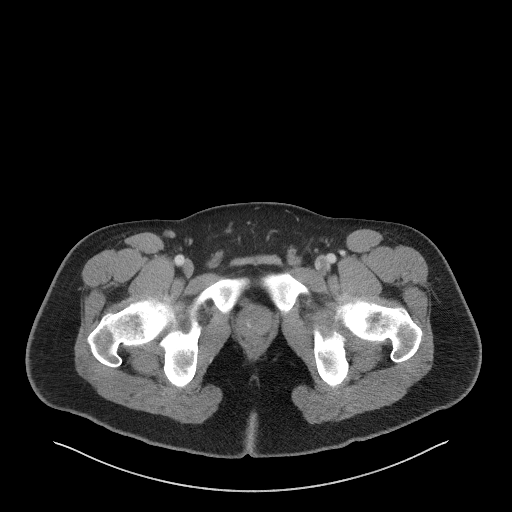
[im 26/108  soft-tissue]
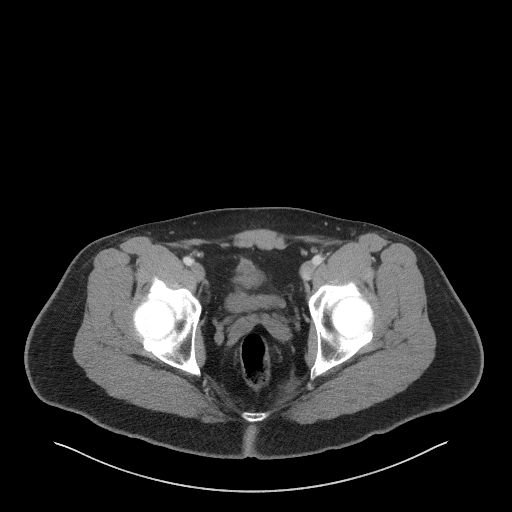
[im 35/108  soft-tissue]
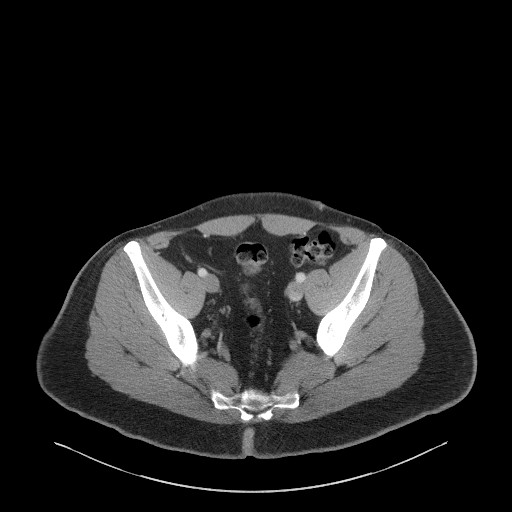
[im 43/108  soft-tissue]
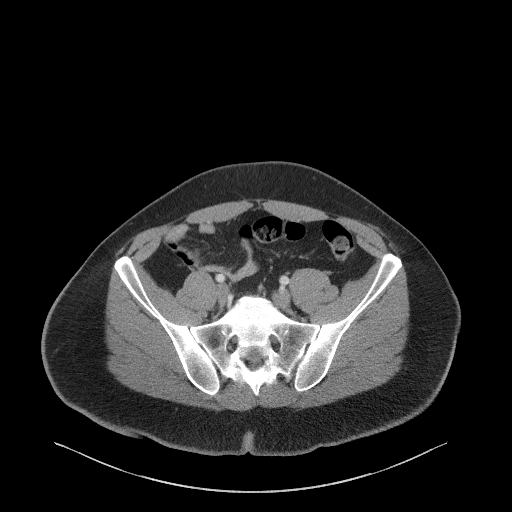
[im 52/108  soft-tissue]
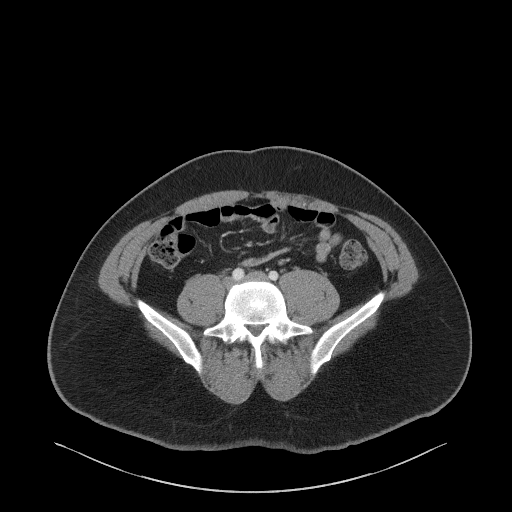
[im 60/108  soft-tissue]
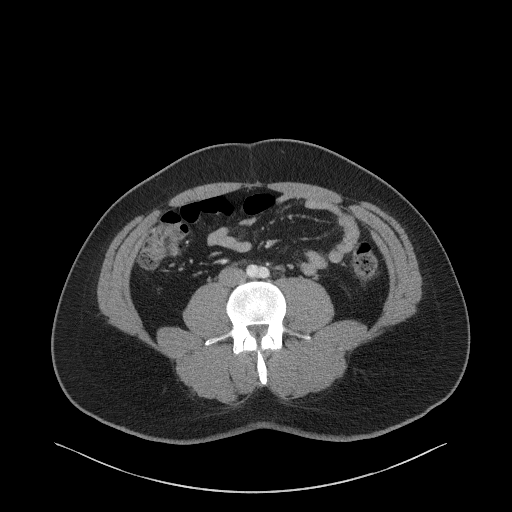
[im 69/108  soft-tissue]
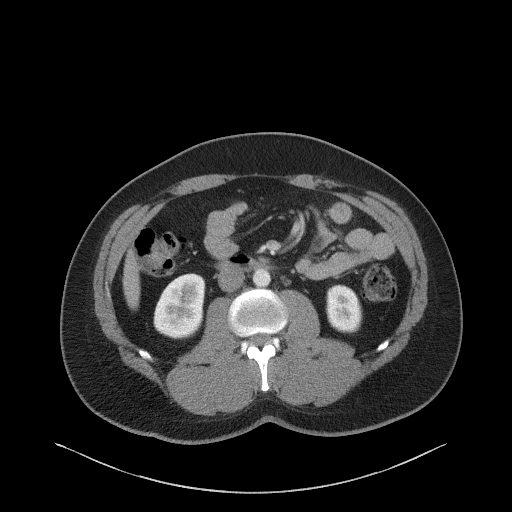
[im 78/108  soft-tissue]
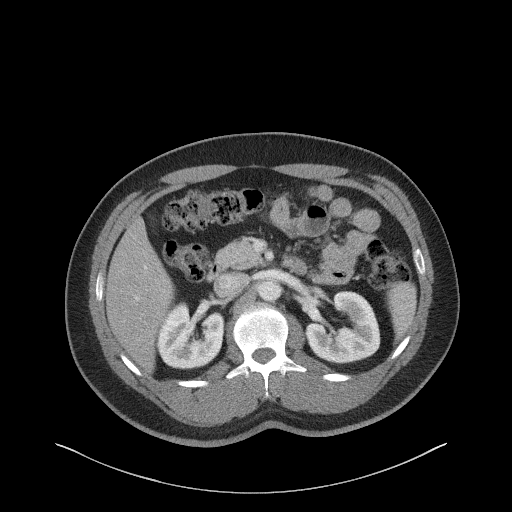
[im 78/108  bone]
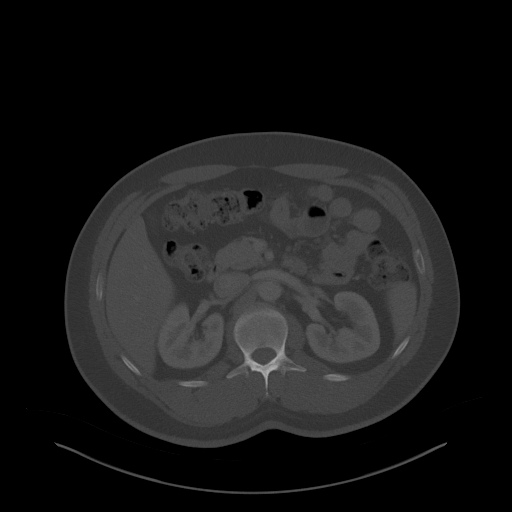
[im 86/108  soft-tissue]
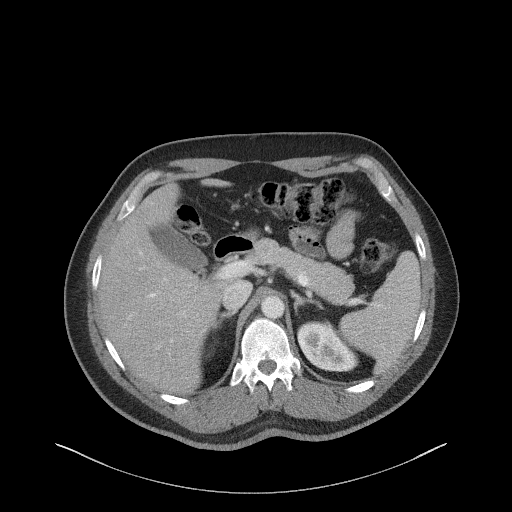
[im 95/108  soft-tissue]
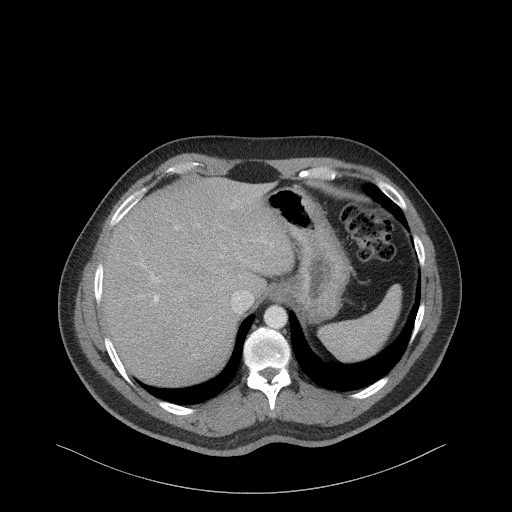
[im 103/108  soft-tissue]
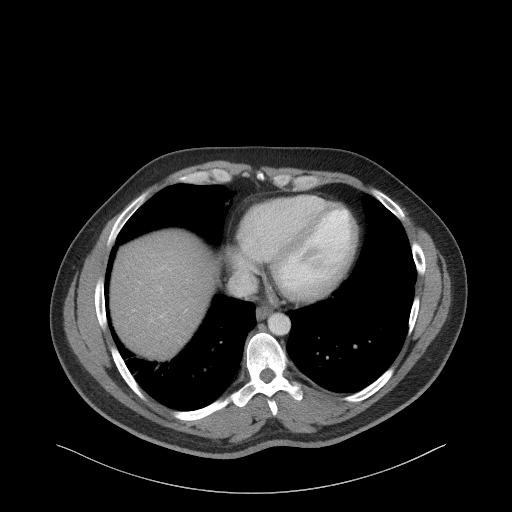

[Series 6: a/p w/ cor · coronal · 0.93mm/px · 3 of 151 slices shown]
[im 51/151  soft-tissue]
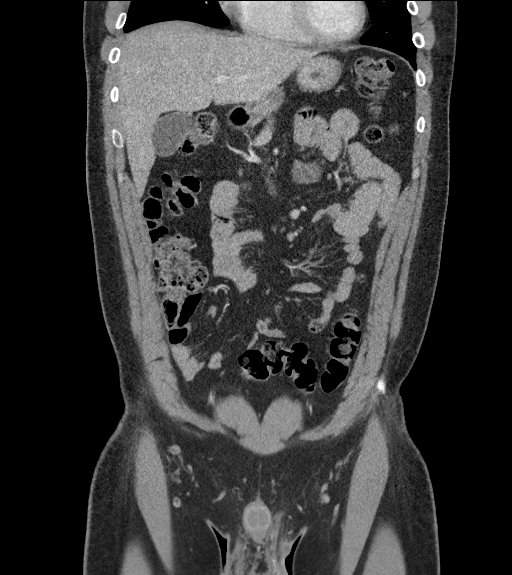
[im 67/151  soft-tissue]
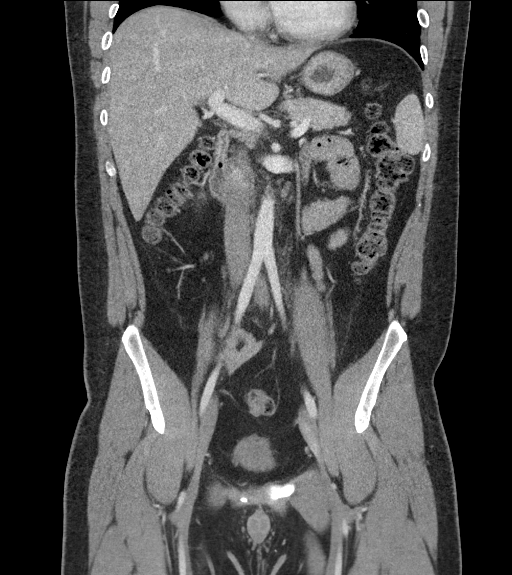
[im 84/151  soft-tissue]
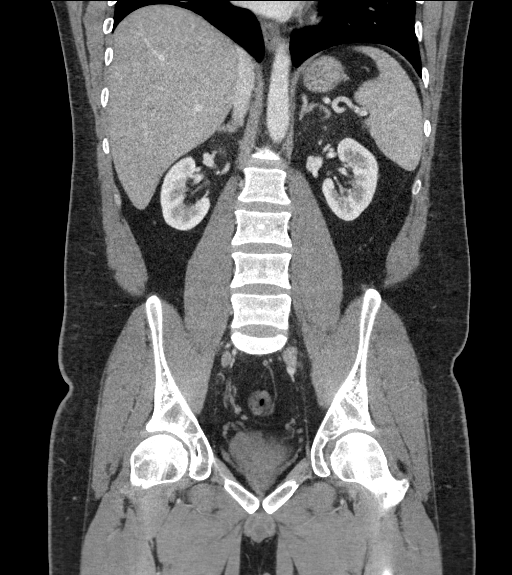

[15 of 46 positions shown; findings below may reference images not displayed]

FINDINGS: Lower chest: No acute abnormality. Bandlike scarring or atelectasis
of the right lung.

Hepatobiliary: No solid liver abnormality is seen. No gallstones,
gallbladder wall thickening, or biliary dilatation.

Pancreas: Unremarkable. No pancreatic ductal dilatation or
surrounding inflammatory changes.

Spleen: Normal in size without significant abnormality.

Adrenals/Urinary Tract: Adrenal glands are unremarkable. Kidneys are
normal, without renal calculi, solid lesion, or hydronephrosis.
Bladder is unremarkable.

Stomach/Bowel: Stomach is within normal limits. Appendix is
surgically absent. No evidence of bowel wall thickening, distention,
or inflammatory changes.

Vascular/Lymphatic: No significant vascular findings are present. No
enlarged abdominal or pelvic lymph nodes.

Reproductive: No mass or other significant abnormality.

Other: No abdominal wall hernia or abnormality. No abdominopelvic
ascites.

Musculoskeletal: No acute or significant osseous findings.
IMPRESSION: 1. No CT findings of the abdomen or pelvis to explain left lower
quadrant abdominal pain.
2. Status post appendectomy.

ADDENDUM:
There is a 1 cm nodular area along the superior aspect of the
lateral left scrotal wall with induration of the surrounding tissue
most consistent with phlegmonous changes or developing abscess.

These findings were discussed with Dr. Matul, at the time of this
addendum.

*** End of Addendum ***
FINDINGS: Lower chest: No acute abnormality. Bandlike scarring or atelectasis
of the right lung.

Hepatobiliary: No solid liver abnormality is seen. No gallstones,
gallbladder wall thickening, or biliary dilatation.

Pancreas: Unremarkable. No pancreatic ductal dilatation or
surrounding inflammatory changes.

Spleen: Normal in size without significant abnormality.

Adrenals/Urinary Tract: Adrenal glands are unremarkable. Kidneys are
normal, without renal calculi, solid lesion, or hydronephrosis.
Bladder is unremarkable.

Stomach/Bowel: Stomach is within normal limits. Appendix is
surgically absent. No evidence of bowel wall thickening, distention,
or inflammatory changes.

Vascular/Lymphatic: No significant vascular findings are present. No
enlarged abdominal or pelvic lymph nodes.

Reproductive: No mass or other significant abnormality.

Other: No abdominal wall hernia or abnormality. No abdominopelvic
ascites.

Musculoskeletal: No acute or significant osseous findings.
IMPRESSION: 1. No CT findings of the abdomen or pelvis to explain left lower
quadrant abdominal pain.
2. Status post appendectomy.

## 2022-08-06 IMAGING — US US SCROTUM W/ DOPPLER COMPLETE
1 series · 13 of 25 positions shown · non-contrast
Comparison: September 01, 2020

CLINICAL DATA: Worsening testicular pain.

EXAM:
SCROTAL ULTRASOUND
DOPPLER ULTRASOUND OF THE TESTICLES
TECHNIQUE: Complete ultrasound examination of the testicles, epididymis, and
other scrotal structures was performed. Color and spectral Doppler
ultrasound were also utilized to evaluate blood flow to the
testicles.

[Series 1: us scrotum w/doppler · 13 of 71 slices shown]
[im 1/71]
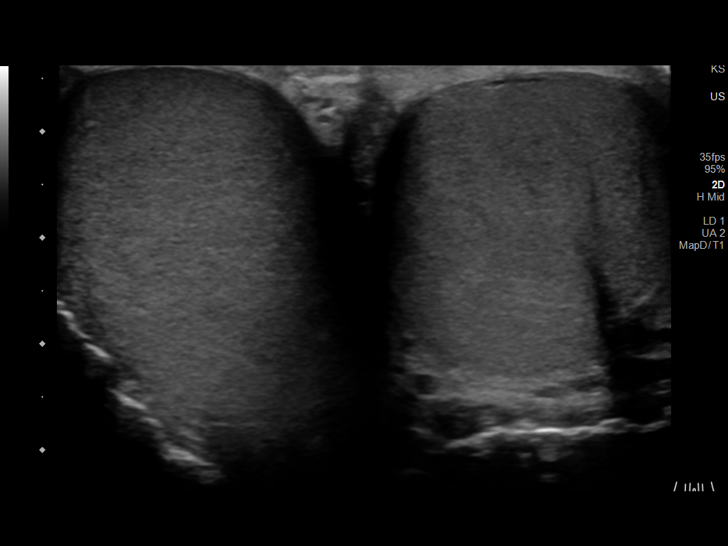
[im 6/71]
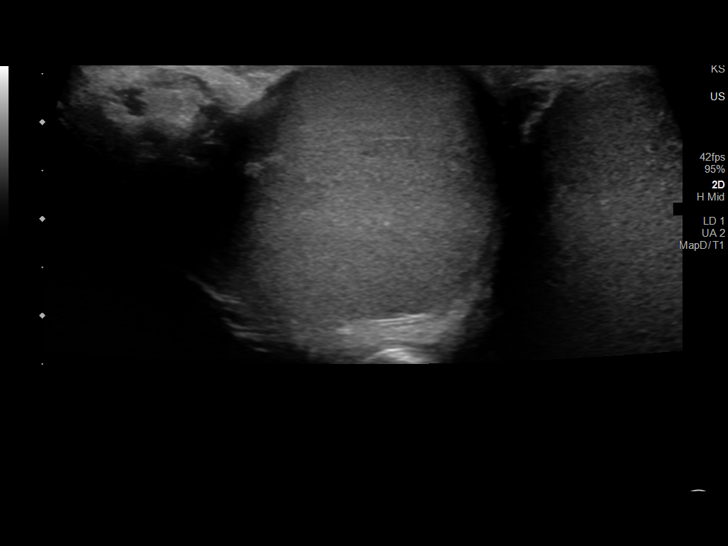
[im 12/71]
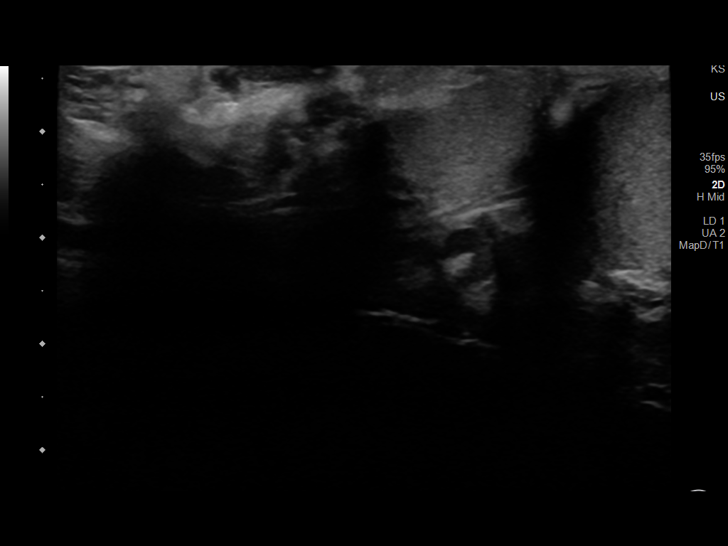
[im 18/71]
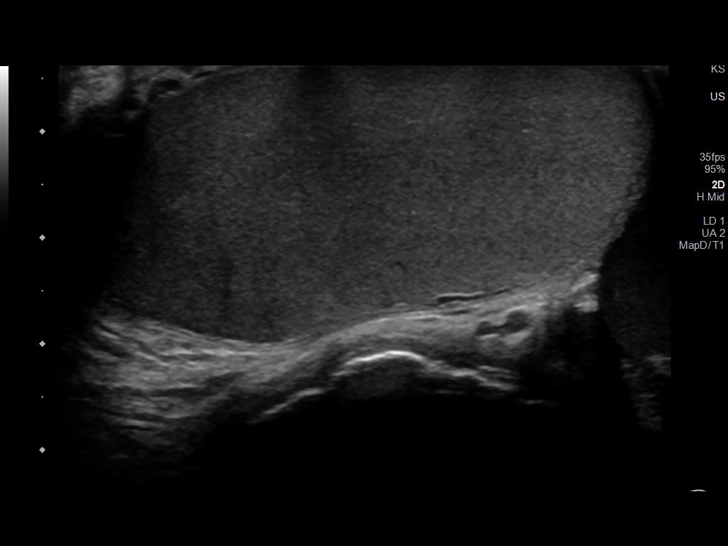
[im 24/71]
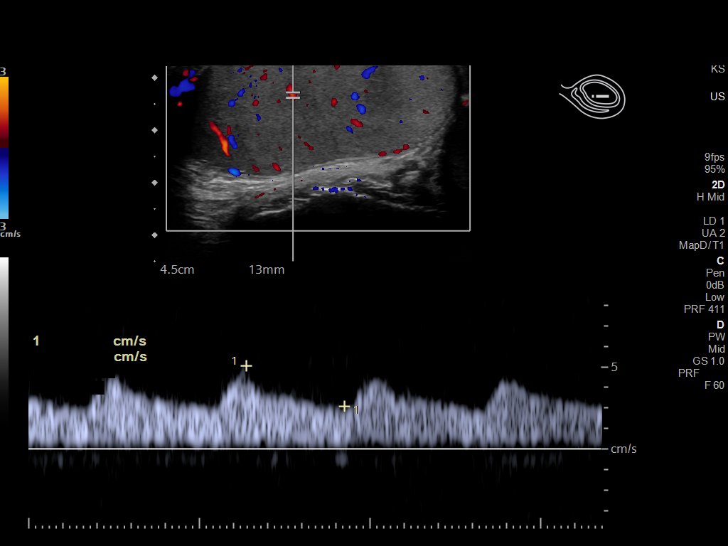
[im 30/71]
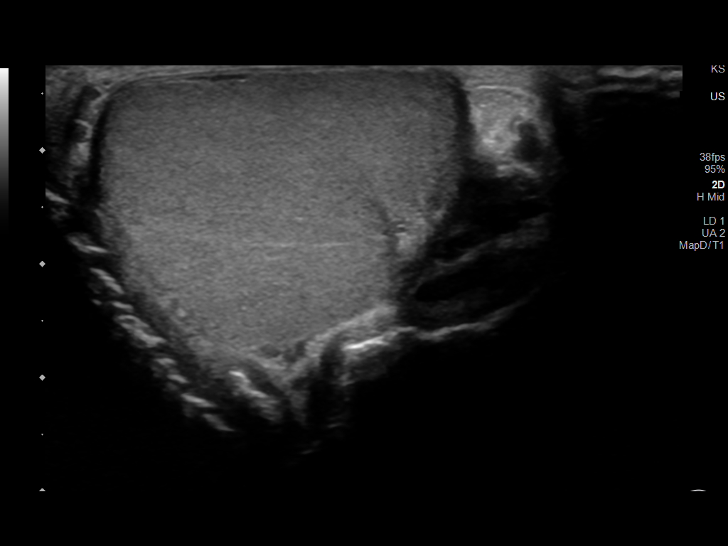
[im 36/71]
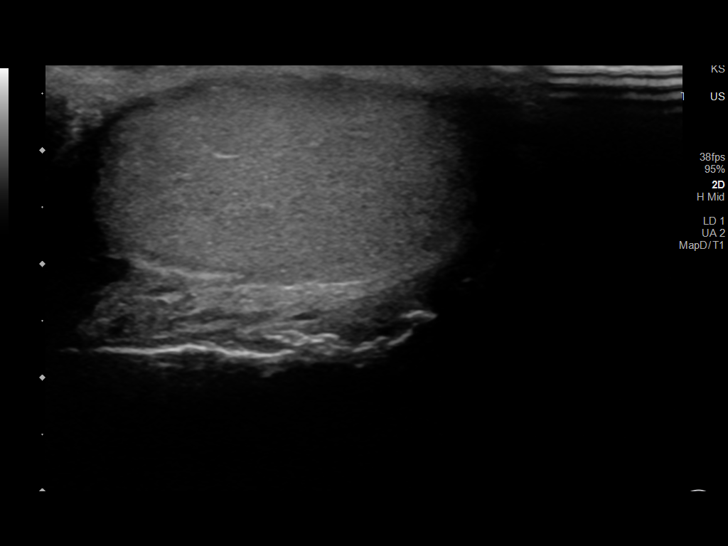
[im 41/71]
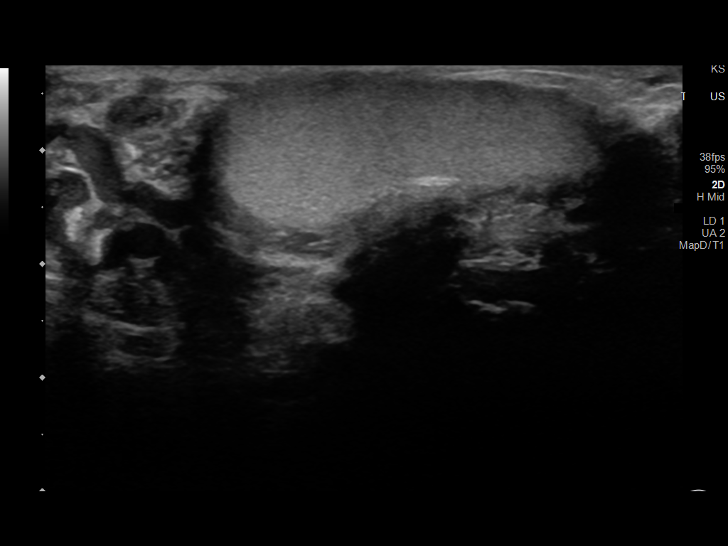
[im 47/71]
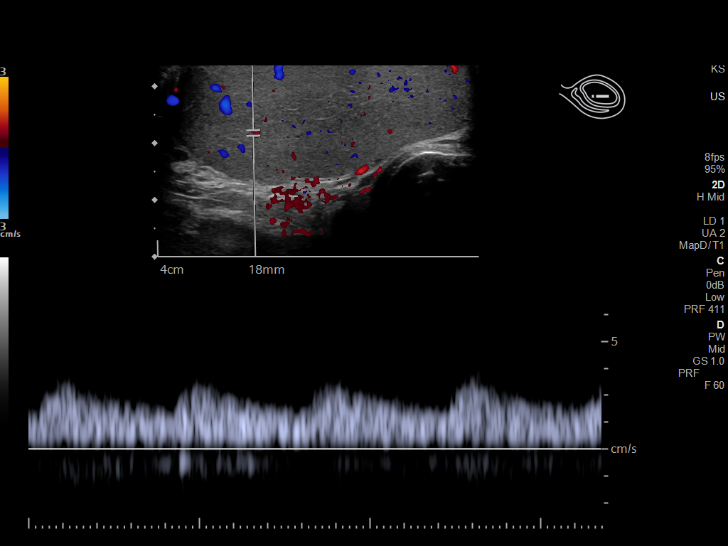
[im 53/71]
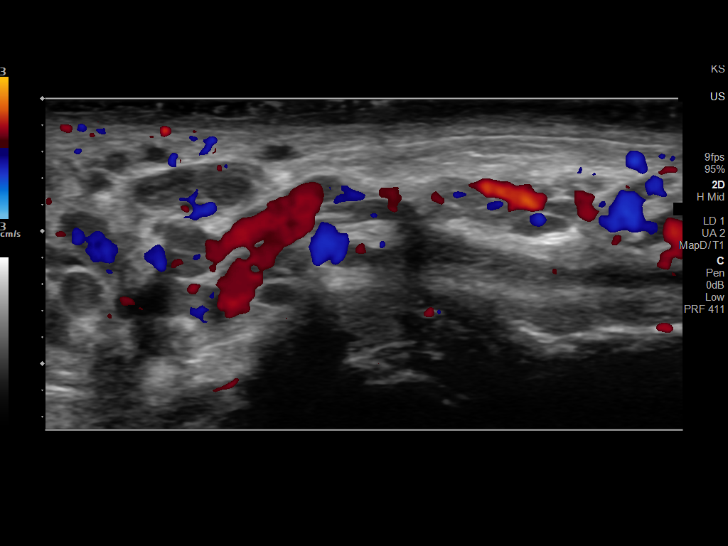
[im 59/71]
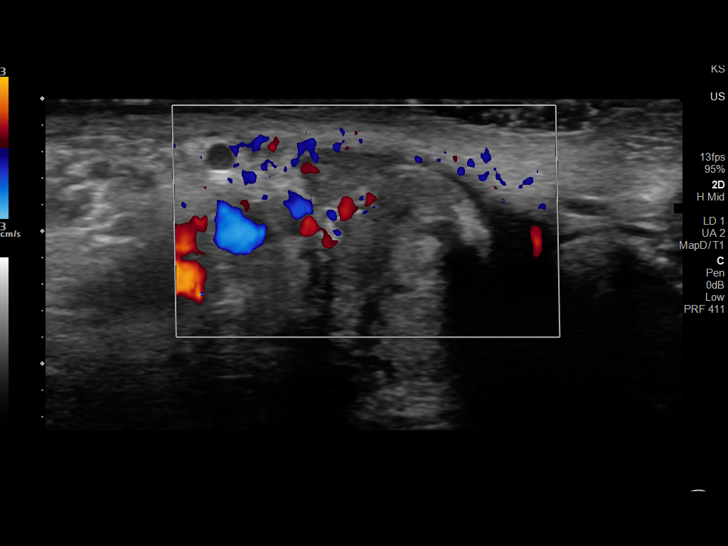
[im 65/71]
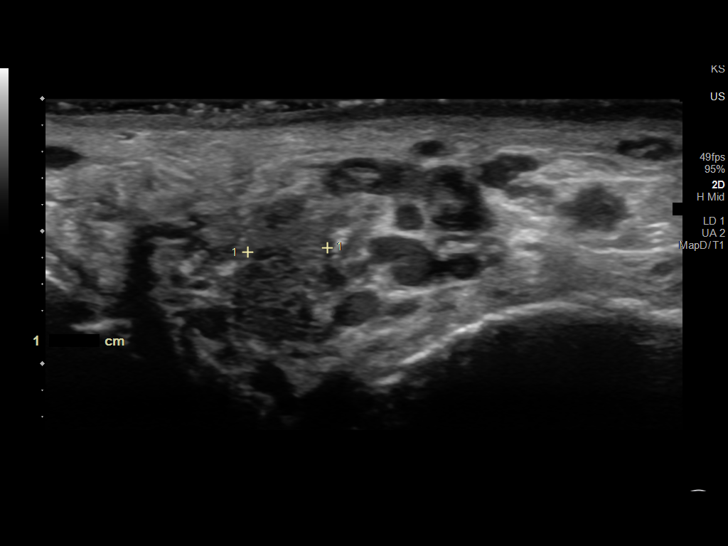
[im 71/71]
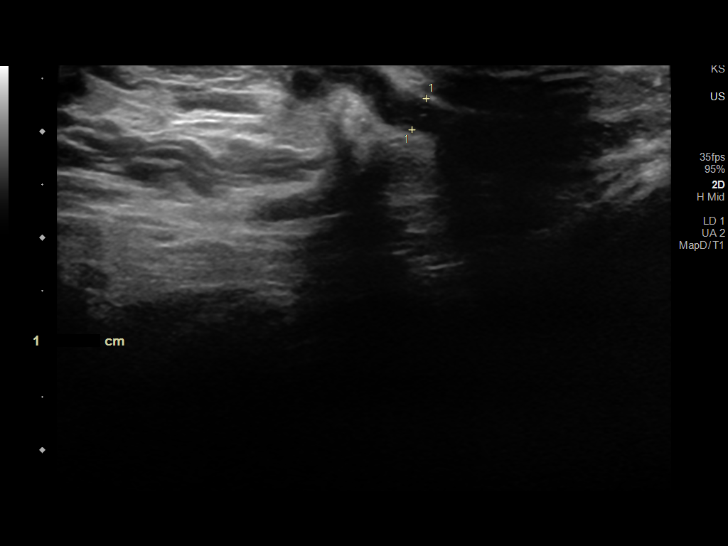

[13 of 25 positions shown; findings below may reference images not displayed]

FINDINGS: Right testicle

Measurements: 4.8 cm x 2.6 cm x 3.0 cm. No mass or microlithiasis
visualized.

Left testicle

Measurements: 5.4 cm x 2.3 cm x 3.3 cm. No mass or microlithiasis
visualized.

Right epididymis:  Normal in size and appearance.

Left epididymis:  Normal in size and appearance.

Hydrocele:  A very small right-sided hydrocele is seen.

Varicocele:  Bilateral varicoceles are noted.

Pulsed Doppler interrogation of both testes demonstrates normal low
resistance arterial and venous waveforms bilaterally.

Other: Possible bleeding from the urethra is noted by the ultrasound
technologist.
IMPRESSION: 1. Bilateral varicoceles.
2. Possible bleeding from the urethra, as per the ultrasound
technologist. Correlation with physical examination is recommended.
3. Very small right-sided hydrocele.
